# Patient Record
Sex: Male | Born: 1990 | Race: Black or African American | Hispanic: No | Marital: Married | Smoking: Never smoker
Health system: Southern US, Community
[De-identification: ages and names within clinical notes are randomized; demographics above are authoritative.]

## PROBLEM LIST (undated history)

## (undated) ENCOUNTER — Ambulatory Visit: Discharge status: Absent without leave | Payer: Self-pay

## (undated) DIAGNOSIS — R519 Headache, unspecified: Secondary | ICD-10-CM

## (undated) DIAGNOSIS — R51 Headache: Secondary | ICD-10-CM

## (undated) HISTORY — PX: FINGER SURGERY: SHX640

---
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
## 2014-06-04 ENCOUNTER — Ambulatory Visit: Payer: Self-pay | Admitting: Emergency Medicine

## 2015-08-30 ENCOUNTER — Encounter: Payer: Self-pay | Admitting: Emergency Medicine

## 2015-08-30 ENCOUNTER — Ambulatory Visit (INDEPENDENT_AMBULATORY_CARE_PROVIDER_SITE_OTHER)
Admit: 2015-08-30 | Discharge: 2015-08-30 | Disposition: A | Discharge status: Home / Self Care | Payer: 59 | Attending: Family Medicine | Admitting: Family Medicine

## 2015-08-30 ENCOUNTER — Ambulatory Visit
Admission: EM | Admit: 2015-08-30 | Discharge: 2015-08-30 | Disposition: A | Discharge status: Home / Self Care | Payer: 59 | Attending: Family Medicine | Admitting: Family Medicine

## 2015-08-30 DIAGNOSIS — R51 Headache: Secondary | ICD-10-CM

## 2015-08-30 DIAGNOSIS — R519 Headache, unspecified: Secondary | ICD-10-CM

## 2015-08-30 MED ORDER — SUMATRIPTAN SUCCINATE 6 MG/0.5ML ~~LOC~~ SOLN
6.0000 mg | Freq: Once | SUBCUTANEOUS | Status: AC
  Administered 2015-08-30: 6 mg via SUBCUTANEOUS

## 2015-08-30 MED ORDER — SUMATRIPTAN SUCCINATE 100 MG PO TABS: 100.0000 mg | ORAL_TABLET | ORAL | Status: DC | PRN

## 2015-08-30 MED ORDER — BUTALBITAL-APAP-CAFFEINE 50-325-40 MG PO TABS: 1.0000 | ORAL_TABLET | Freq: Four times a day (QID) | ORAL | Status: AC | PRN

## 2015-08-30 MED ORDER — ONDANSETRON 8 MG PO TBDP: 8.0000 mg | ORAL_TABLET | Freq: Three times a day (TID) | ORAL | Status: DC | PRN

## 2015-08-30 NOTE — ED Provider Notes (Signed)
CSN: [id number]     Arrival date & time 08/30/15  [id number] History   First MD Initiated Contact with Patient 08/30/15 [phone]     Chief Complaint  Patient presents with  . Headache   (Consider location/radiation/quality/duration/timing/severity/associated sxs/prior Treatment) Patient is a 24 y.o. male presenting with headaches. The history is provided by the patient. No language interpreter was used.  Headache Pain location:  Frontal Radiates to:  Does not radiate Severity currently:  3/10 Severity at highest:  10/10 Onset quality:  Sudden Duration:  7 days Timing:  Intermittent Progression:  Waxing and waning Chronicity:  New Similar to prior headaches: no   Context: bright light   Relieved by:  Nothing Ineffective treatments:  None tried Associated symptoms: blurred vision, nausea, photophobia and visual change   Nausea:    Severity:  Moderate   Onset quality:  Sudden   History reviewed. No pertinent past medical history. History reviewed. No pertinent past surgical history. History reviewed. No pertinent family history. Social History  Substance Use Topics  . Smoking status: Never Smoker   . Smokeless tobacco: None  . Alcohol Use: No    Review of Systems  Eyes: Positive for blurred vision and photophobia.  Gastrointestinal: Positive for nausea.  Neurological: Positive for headaches.  All other systems reviewed and are negative.   Allergies  Review of patient's allergies indicates no known allergies.  Home Medications   Prior to Admission medications   Not on File   Meds Ordered and Administered this Visit   Medications  SUMAtriptan (IMITREX) injection 6 mg (6 mg Subcutaneous Given 08/30/15 0817)    BP 129/74 mmHg  Pulse 72  Temp(Src) 97 F (36.1 C) (Tympanic)  Resp 16  Ht 6\' 1"  (1.854 m)  Wt 205 lb (92.987 kg)  BMI 27.05 kg/m2  SpO2 100% No data found.   Physical Exam  Constitutional: He is oriented to person, place, and time. He appears  well-developed and well-nourished.  HENT:  Head: Normocephalic and atraumatic.  Right Ear: External ear normal.  Left Ear: External ear normal.  Nose: Nose normal.  Mouth/Throat: Oropharynx is clear and moist.  Eyes: Conjunctivae and EOM are normal. Pupils are equal, round, and reactive to light.  Neck: Normal range of motion. Neck supple. No thyromegaly present.  Cardiovascular: Normal rate and regular rhythm.   Pulmonary/Chest: Effort normal and breath sounds normal.  Abdominal: Soft. Bowel sounds are normal.  Musculoskeletal: Normal range of motion.  Neurological: He is alert and oriented to person, place, and time. He displays normal reflexes. No cranial nerve deficit. Coordination normal.  Skin: Skin is warm. No erythema.  Psychiatric: He has a normal mood and affect.  Vitals reviewed.   ED Course  Procedures (including critical care time)  Labs Review Labs Reviewed - No data to display  Imaging Review Ct Head Wo Contrast  08/30/2015  CLINICAL DATA:  Severe headache for 1 week. No known injury. Initial encounter. EXAM: CT HEAD WITHOUT CONTRAST TECHNIQUE: Contiguous axial images were obtained from the base of the skull through the vertex without intravenous contrast. COMPARISON:  None. FINDINGS: The brain appears normal without hemorrhage, infarct, mass lesion, mass effect, midline shift or abnormal extra-axial fluid collection. No hydrocephalus or pneumocephalus. Imaged paranasal sinuses and mastoid air cells are clear. The calvarium is intact. IMPRESSION: Normal head CT. Electronically Signed   By: Drusilla Kannerhomas  Dalessio M.D.   On: 08/30/2015 10:12     Visual Acuity Review  Right Eye Distance:   Left Eye  Distance:   Bilateral Distance:    Right Eye Near:   Left Eye Near:    Bilateral Near:         MDM   1. Nonintractable headache, unspecified chronicity pattern, unspecified headache type     Suspected migraine headache just reluctant to make that final diagnosis  with one visit. It did seem to respond to Imitrex. We'll send him home on by mouth Imitrex with instructions on how to use Imitrex, Fioricet if needed for resistant headache and Zofran for nausea when necessary. Recommend following up with establishing a PCP and will give information on Dr. Schuyler Amor. Work note for today and tomorrow.   Hassan Rowan, MD 08/30/15 1116

## 2015-08-30 NOTE — ED Notes (Signed)
CT [facility] of the head [facility]

## 2015-08-30 NOTE — Discharge Instructions (Signed)
General Headache Without Cause °A headache is pain or discomfort felt around the head or neck area. There are many causes and types of headaches. In some cases, the cause may not be found.  °HOME CARE  °Managing Pain °· Take over-the-counter and prescription medicines only as told by your doctor. °· Lie down in a dark, quiet room when you have a headache. °· If directed, apply ice to the head and neck area: °¨ Put ice in a plastic bag. °¨ Place a towel between your skin and the bag. °¨ Leave the ice on for 20 minutes, 2-3 times per day. °· Use a heating pad or hot shower to apply heat to the head and neck area as told by your doctor. °· Keep lights dim if bright lights bother you or make your headaches worse. °Eating and Drinking °· Eat meals on a regular schedule. °· Lessen how much alcohol you drink. °· Lessen how much caffeine you drink, or stop drinking caffeine. °General Instructions °· Keep all follow-up visits as told by your doctor. This is important. °· Keep a journal to find out if certain things bring on headaches. For example, write down: °· What you eat and drink. °· How much sleep you get. °· Any change to your diet or medicines. °· Relax by getting a massage or doing other relaxing activities. °· Lessen stress. °· Sit up straight. Do not tighten (tense) your muscles. °· Do not use tobacco products. This includes cigarettes, chewing tobacco, or e-cigarettes. If you need help quitting, ask your doctor. °· Exercise regularly as told by your doctor. °· Get enough sleep. This often means 7-9 hours of sleep. °GET HELP IF: °· Your symptoms are not helped by medicine. °· You have a headache that feels different than the other headaches. °· You feel sick to your stomach (nauseous) or you throw up (vomit). °· You have a fever. °GET HELP RIGHT AWAY IF:  °· Your headache becomes really bad. °· You keep throwing up. °· You have a stiff neck. °· You have trouble seeing. °· You have trouble speaking. °· You have  pain in the eye or ear. °· Your muscles are weak or you lose muscle control. °· You lose your balance or have trouble walking. °· You feel like you will pass out (faint) or you pass out. °· You have confusion. °  °This information is not intended to replace advice given to you by your health care provider. Make sure you discuss any questions you have with your health care provider. °  °Document Released: 08/01/2008 Document Revised: 07/14/2015 Document Reviewed: 02/15/2015 °Elsevier Interactive Patient Education ©2016 [employer] ° °Migraine Headache °A migraine headache is very bad, throbbing pain on one or both sides of your head. Talk to your doctor about what things may bring on (trigger) your migraine headaches. °HOME CARE °· Only take medicines as told by your doctor. °· Lie down in a dark, quiet room when you have a migraine. °· Keep a journal to find out if certain things bring on migraine headaches. For example, write down: °¨ What you eat and drink. °¨ How much sleep you get. °¨ Any change to your diet or medicines. °· Lessen how much alcohol you drink. °· Quit smoking if you smoke. °· Get enough sleep. °· Lessen any stress in your life. °· Keep lights dim if bright lights bother you or make your migraines worse. °GET HELP RIGHT AWAY IF:  °· Your migraine becomes really bad. °·   You have a fever. °· You have a stiff neck. °· You have trouble seeing. °· Your muscles are weak, or you lose muscle control. °· You lose your balance or have trouble walking. °· You feel like you will pass out (faint), or you pass out. °· You have really bad symptoms that are different than your first symptoms. °MAKE SURE YOU:  °· Understand these instructions. °· Will watch your condition. °· Will get help right away if you are not doing well or get worse. °  °This information is not intended to replace advice given to you by your health care provider. Make sure you discuss any questions you have with your health care  provider. °  °Document Released: 08/01/2008 Document Revised: 01/15/2012 Document Reviewed: 06/30/2013 °Elsevier Interactive Patient Education ©2016 [employer] ° °

## 2015-08-30 NOTE — ED Notes (Signed)
Patient c/o headaches and nausea and vomitting off and on for a week.  Patient denies any cold symptoms.

## 2015-10-05 ENCOUNTER — Ambulatory Visit
Admission: EM | Admit: 2015-10-05 | Discharge: 2015-10-05 | Disposition: A | Discharge status: Home / Self Care | Payer: 59 | Attending: Family Medicine | Admitting: Family Medicine

## 2015-10-05 DIAGNOSIS — G43009 Migraine without aura, not intractable, without status migrainosus: Secondary | ICD-10-CM | POA: Diagnosis not present

## 2015-10-05 DIAGNOSIS — T50905A Adverse effect of unspecified drugs, medicaments and biological substances, initial encounter: Secondary | ICD-10-CM | POA: Diagnosis not present

## 2015-10-05 HISTORY — DX: Headache: R51

## 2015-10-05 HISTORY — DX: Headache, unspecified: R51.9

## 2015-10-05 MED ORDER — SUMATRIPTAN SUCCINATE 100 MG PO TABS: 100.0000 mg | ORAL_TABLET | ORAL | Status: DC | PRN

## 2015-10-05 MED ORDER — MELOXICAM 15 MG PO TABS
15.0000 mg | ORAL_TABLET | Freq: Every day | ORAL | Status: DC
Start: 2015-10-05 — End: 2016-10-09

## 2015-10-05 NOTE — ED Notes (Signed)
States seen here 08/30/15 with c/o migraine h/a. Given medication "that is too strong to go to work using and my supervisor told me to get something different so I can work safe"

## 2015-10-05 NOTE — Discharge Instructions (Signed)
Migraine Headache  A migraine headache is very bad, throbbing pain on one or both sides of your head. Talk to your doctor about what things may bring on (trigger) your migraine headaches.  HOME CARE  · Only take medicines as told by your doctor.  · Lie down in a dark, quiet room when you have a migraine.  · Keep a journal to find out if certain things bring on migraine headaches. For example, write down:    What you eat and drink.    How much sleep you get.    Any change to your diet or medicines.  · Lessen how much alcohol you drink.  · Quit smoking if you smoke.  · Get enough sleep.  · Lessen any stress in your life.  · Keep lights dim if bright lights bother you or make your migraines worse.  GET HELP RIGHT AWAY IF:   · Your migraine becomes really bad.  · You have a fever.  · You have a stiff neck.  · You have trouble seeing.  · Your muscles are weak, or you lose muscle control.  · You lose your balance or have trouble walking.  · You feel like you will pass out (faint), or you pass out.  · You have really bad symptoms that are different than your first symptoms.  MAKE SURE YOU:   · Understand these instructions.  · Will watch your condition.  · Will get help right away if you are not doing well or get worse.     This information is not intended to replace advice given to you by your health care provider. Make sure you discuss any questions you have with your health care provider.     Document Released: 08/01/2008 Document Revised: 01/15/2012 Document Reviewed: 06/30/2013  Elsevier Interactive Patient Education ©2016 [employer]

## 2015-10-05 NOTE — ED Provider Notes (Signed)
CSN: [id number]     Arrival date & time 10/05/15  1325 History   First MD Initiated Contact with Patient 10/05/15 1458    Nurses notes were reviewed. Chief Complaint  Patient presents with  . Medication Refill    Patient is here for follow-up and medication side effect. He states Imitrex is working well for his headaches and is really not having headaches before however when he has had a breakout headache he's taking Imitrex and the fears that he is experiencing sedation. Last night his work sent him home when he took combination to medications. He did not know which medication caused the sedation effect. He reports no headaches today.  (Consider location/radiation/quality/duration/timing/severity/associated sxs/prior Treatment) The history is provided by the patient. No language interpreter was used.    Past Medical History  Diagnosis Date  . Headache    History reviewed. No pertinent past surgical history. Family History  Problem Relation Age of Onset  . Hypertension Father    Social History  Substance Use Topics  . Smoking status: Never Smoker   . Smokeless tobacco: None  . Alcohol Use: No    Review of Systems  All other systems reviewed and are negative.   Allergies  Review of patient's allergies indicates no known allergies.  Home Medications   Prior to Admission medications   Medication Sig Start Date End Date Taking? Authorizing Provider  butalbital-acetaminophen-caffeine (FIORICET) 50-325-40 MG tablet Take 1-2 tablets by mouth every 6 (six) hours as needed for headache. 08/30/15 08/29/16 Yes Hassan RowanEugene Aki Burdin, MD  ondansetron (ZOFRAN ODT) 8 MG disintegrating tablet Take 1 tablet (8 mg total) by mouth every 8 (eight) hours as needed for nausea or vomiting. 08/30/15  Yes Hassan RowanEugene Ewing Fandino, MD  meloxicam (MOBIC) 15 MG tablet Take 1 tablet (15 mg total) by mouth daily. 10/05/15   Hassan RowanEugene Ashan Cueva, MD  SUMAtriptan (IMITREX) 100 MG tablet Take 1 tablet (100 mg total) by mouth every 2  (two) hours as needed for migraine. May repeat in 2 hours if headache persists or recurs. 10/05/15   Hassan RowanEugene Song Garris, MD   Meds Ordered and Administered this Visit  Medications - No data to display  BP 141/77 mmHg  Pulse 76  Temp(Src) 97.6 F (36.4 C) (Tympanic)  Resp 16  Ht 6\' 1"  (1.854 m)  Wt 205 lb (92.987 kg)  BMI 27.05 kg/m2  SpO2 100% No data found.   Physical Exam  Constitutional: He is oriented to person, place, and time. He appears well-developed and well-nourished.  HENT:  Head: Normocephalic and atraumatic.  Eyes: Pupils are equal, round, and reactive to light.  Neck: Neck supple.  Musculoskeletal: Normal range of motion.  Neurological: He is alert and oriented to person, place, and time.  Skin: Skin is warm and dry.  Psychiatric: He has a normal mood and affect.  Vitals reviewed.   ED Course  Procedures (including critical care time)  Labs Review Labs Reviewed - No data to display  Imaging Review No results found.   Visual Acuity Review  Right Eye Distance:   Left Eye Distance:   Bilateral Distance:    Right Eye Near:   Left Eye Near:    Bilateral Near:         MDM   1. Migraine without aura and without status migrainosus, not intractable   2. Medication side effects present, initial encounter     Patient is here for his recurrent migraines. Imitrex worked real well renew his prescription Imitrex and place on Mobic  15 mg which I recommend he use assistive if your sets. He may still use the Fioricet on weekends when he is not working. If needed and I did recommend he follow-up PCP to get established so he can get further prescriptions of Imitrex in the future.     Hassan Rowan, MD 10/05/15 [phone]

## 2016-10-09 ENCOUNTER — Ambulatory Visit
Admission: EM | Admit: 2016-10-09 | Discharge: 2016-10-09 | Disposition: A | Discharge status: Home / Self Care | Payer: 59 | Attending: Family Medicine | Admitting: Family Medicine

## 2016-10-09 ENCOUNTER — Ambulatory Visit (INDEPENDENT_AMBULATORY_CARE_PROVIDER_SITE_OTHER): Payer: 59

## 2016-10-09 DIAGNOSIS — R059 Cough, unspecified: Secondary | ICD-10-CM

## 2016-10-09 DIAGNOSIS — R918 Other nonspecific abnormal finding of lung field: Secondary | ICD-10-CM | POA: Diagnosis not present

## 2016-10-09 DIAGNOSIS — J111 Influenza due to unidentified influenza virus with other respiratory manifestations: Secondary | ICD-10-CM

## 2016-10-09 DIAGNOSIS — R42 Dizziness and giddiness: Secondary | ICD-10-CM | POA: Diagnosis present

## 2016-10-09 DIAGNOSIS — R9389 Abnormal findings on diagnostic imaging of other specified body structures: Secondary | ICD-10-CM

## 2016-10-09 DIAGNOSIS — J40 Bronchitis, not specified as acute or chronic: Secondary | ICD-10-CM | POA: Diagnosis not present

## 2016-10-09 DIAGNOSIS — R938 Abnormal findings on diagnostic imaging of other specified body structures: Secondary | ICD-10-CM

## 2016-10-09 DIAGNOSIS — R05 Cough: Secondary | ICD-10-CM | POA: Diagnosis not present

## 2016-10-09 LAB — RAPID INFLUENZA A&B ANTIGENS
Influenza A (ARMC): NEGATIVE
Influenza B (ARMC): NEGATIVE

## 2016-10-09 MED ORDER — LEVOFLOXACIN 500 MG PO TABS: 500.0000 mg | ORAL_TABLET | Freq: Every day | ORAL | 0 refills | Status: DC

## 2016-10-09 MED ORDER — OSELTAMIVIR PHOSPHATE 75 MG PO CAPS: 75.0000 mg | ORAL_CAPSULE | Freq: Two times a day (BID) | ORAL | 0 refills | Status: DC

## 2016-10-09 MED ORDER — ACETAMINOPHEN 325 MG PO TABS
650.0000 mg | ORAL_TABLET | Freq: Once | ORAL | Status: AC
  Administered 2016-10-09: 650 mg via ORAL

## 2016-10-09 NOTE — ED Provider Notes (Signed)
[facility]    CSN: [id number] Arrival date & time: 10/09/16  1552     History   Chief Complaint Chief Complaint  Patient presents with  . Dizziness  . Tachycardia    HPI Shane Bradshaw is a 25 y.o. male.   25 yo male with a c/o 2 days of fevers, bodyaches, shortness of breath, chest pain and dizziness. Denies palpitations, neck/jaw pain, left arm pain, cough, wheezing. States otherwise generally healthy.    The history is provided by the patient.  Dizziness    Past Medical History:  Diagnosis Date  . Headache     There are no active problems to display for this patient.   History reviewed. No pertinent surgical history.     Home Medications    Prior to Admission medications   Medication Sig Start Date End Date Taking? Authorizing Provider  ibuprofen (ADVIL,MOTRIN) 800 MG tablet Take 800 mg by mouth every 8 (eight) hours as needed.   Yes Historical Provider, MD  levofloxacin (LEVAQUIN) 500 MG tablet Take 1 tablet (500 mg total) by mouth daily. 10/09/16   Payton Mccallumrlando Bijou Easler, MD  oseltamivir (TAMIFLU) 75 MG capsule Take 1 capsule (75 mg total) by mouth 2 (two) times daily. 10/09/16   Payton Mccallumrlando Storm Sovine, MD    Family History Family History  Problem Relation Age of Onset  . Hypertension Father     Social History Social History  Substance Use Topics  . Smoking status: Never Smoker  . Smokeless tobacco: Never Used  . Alcohol use No     Allergies   Patient has no known allergies.   Review of Systems Review of Systems  Neurological: Positive for dizziness.     Physical Exam Triage Vital Signs ED Triage Vitals  Enc Vitals Group     BP 10/09/16 1602 116/74     Pulse Rate 10/09/16 1602 (!) 125     Resp 10/09/16 1602 18     Temp 10/09/16 1602 100.2 F (37.9 C)     Temp Source 10/09/16 1602 Oral     SpO2 10/09/16 1602 98 %     Weight 10/09/16 1602 228 lb (103.4 kg)     Height 10/09/16 1602 6\' 1"  (1.854 m)     Head Circumference --      Peak  Flow --      Pain Score 10/09/16 1604 10     Pain Loc --      Pain Edu? --      Excl. in GC? --    No data found.   Updated Vital Signs BP 116/74 (BP Location: Left Arm)   Pulse (!) 125   Temp 100.2 F (37.9 C) (Oral)   Resp 18   Ht 6\' 1"  (1.854 m)   Wt 228 lb (103.4 kg)   SpO2 98%   BMI 30.08 kg/m   Visual Acuity Right Eye Distance:   Left Eye Distance:   Bilateral Distance:    Right Eye Near:   Left Eye Near:    Bilateral Near:     Physical Exam  Constitutional: He appears well-developed and well-nourished. No distress.  HENT:  Head: Normocephalic and atraumatic.  Right Ear: Tympanic membrane, external ear and ear canal normal.  Left Ear: Tympanic membrane, external ear and ear canal normal.  Nose: Nose normal.  Mouth/Throat: Uvula is midline, oropharynx is clear and moist and mucous membranes are normal. No oropharyngeal exudate or tonsillar abscesses.  Eyes: Conjunctivae and EOM are normal. Pupils are equal,  round, and reactive to light. Right eye exhibits no discharge. Left eye exhibits no discharge. No scleral icterus.  Neck: Normal range of motion. Neck supple. No tracheal deviation present. No thyromegaly present.  Cardiovascular: Normal rate, regular rhythm and normal heart sounds.   Pulmonary/Chest: Effort normal. No stridor. No respiratory distress. He has no wheezes. He has rales (right mid lung). He exhibits no tenderness.  Lymphadenopathy:    He has no cervical adenopathy.  Neurological: He is alert.  Skin: Skin is warm and dry. No rash noted. He is not diaphoretic.  Nursing note and vitals reviewed.    UC Treatments / Results  Labs (all labs ordered are listed, but only abnormal results are displayed) Labs Reviewed  RAPID INFLUENZA A&B ANTIGENS [facility] ONLY)    EKG  EKG Interpretation None       Radiology Dg Chest 2 View  Result Date: 10/09/2016 CLINICAL DATA:  Dizziness.  Shortness of breath . EXAM: CHEST  2 VIEW COMPARISON:  No recent  . FINDINGS: Heart size normal. Linear density is noted over the right mid chest. This may represent mild pleural thickening. To exclude developing pneumomediastinum follow-up PA lateral chest x-ray suggested. Lungs are clear. No pleural effusion or pneumothorax. Mild thoracic spine scoliosis. IMPRESSION: Faint linear density noted over the right mid chest. This may represent mild pleural thickening. To exclude developing pneumomediastinum, follow-up PA lateral chest x-ray suggested. No other focal abnormality identified. These results will be called to the ordering clinician or representative by the Radiologist Assistant, and communication documented in the PACS or zVision Dashboard. Electronically Signed   By: Maisie Fushomas  Register   On: 10/09/2016 16:50    Procedures Procedures (including critical care time)  Medications Ordered in UC Medications  acetaminophen (TYLENOL) tablet 650 mg (650 mg Oral Given 10/09/16 1617)     Initial Impression / Assessment and Plan / UC Course  I have reviewed the triage vital signs and the nursing notes.  Pertinent labs & imaging results that were available during my care of the patient were reviewed by me and considered in my medical decision making (see chart for details).  Clinical Course       Final Clinical Impressions(s) / UC Diagnoses   Final diagnoses:  Cough  Bronchitis  Abnormal chest x-ray  Influenza-like syndrome    New Prescriptions Discharge Medication List as of 10/09/2016  5:12 PM    START taking these medications   Details  levofloxacin (LEVAQUIN) 500 MG tablet Take 1 tablet (500 mg total) by mouth daily., Starting Mon 10/09/2016, Normal       1. x-ray results (abnormal)  and diagnosis reviewed with patient 2. rx as per orders above; reviewed possible side effects, interactions, risks and benefits; rx for Tamiflu also sent to pharmacy 3. Recommend supportive treatment with increased fluids, rest 4. Follow-up with PCP or here in 2-3  days for recheck and repeat CXR (see radiology report); follow up sooner or go to ED if symptoms worsen   Payton Mccallumrlando Adanya Sosinski, MD 10/09/16 1816

## 2016-10-09 NOTE — ED Triage Notes (Signed)
Pt states he woke up this afternoon dizzy and having some chest pains and really bad headache. Yesterday he had the chills and a low grade fever.

## 2016-10-09 NOTE — Discharge Instructions (Signed)
Follow up later this week for recheck or sooner if symptoms worsen

## 2016-10-12 ENCOUNTER — Ambulatory Visit (INDEPENDENT_AMBULATORY_CARE_PROVIDER_SITE_OTHER): Payer: 59

## 2016-10-12 ENCOUNTER — Encounter: Payer: Self-pay | Admitting: *Deleted

## 2016-10-12 ENCOUNTER — Ambulatory Visit
Admission: EM | Admit: 2016-10-12 | Discharge: 2016-10-12 | Disposition: A | Discharge status: Home / Self Care | Payer: 59 | Attending: Family Medicine | Admitting: Family Medicine

## 2016-10-12 DIAGNOSIS — R938 Abnormal findings on diagnostic imaging of other specified body structures: Secondary | ICD-10-CM

## 2016-10-12 DIAGNOSIS — J189 Pneumonia, unspecified organism: Secondary | ICD-10-CM

## 2016-10-12 DIAGNOSIS — J181 Lobar pneumonia, unspecified organism: Secondary | ICD-10-CM | POA: Diagnosis not present

## 2016-10-12 DIAGNOSIS — R0789 Other chest pain: Secondary | ICD-10-CM

## 2016-10-12 DIAGNOSIS — R9389 Abnormal findings on diagnostic imaging of other specified body structures: Secondary | ICD-10-CM

## 2016-10-12 NOTE — ED Provider Notes (Signed)
[facility]    CSN: [id number] Arrival date & time: 10/12/16  1622     History   Chief Complaint Chief Complaint  Patient presents with  . Chest Pain  . Headache  . Follow-up    HPI Shane Bradshaw is a 25 y.o. male.   Patient's here for follow-up with abnormal chest x-ray he was seen by Dr. Izell Carolinaonti Monday at that time chest x-ray was abnormal. They cannot rule out a pneumonia versus a pneumo mediastinum is S follow-up PCP came back here for follow-up. Axis states he feels better but he still has some concern about his diagnosis. He never smoked father has hypertension he has no known drug allergies he's taking Levaquin for pneumonia at this time.   The history is provided by the patient and a significant other. No language interpreter was used.  Chest Pain  Pain location:  Substernal area and R chest Pain radiates to:  Does not radiate Pain severity:  Mild Progression:  Improving Chronicity:  New Context: not breathing, not drug use and not eating   Relieved by: Antibiotics(Levaquin) Associated symptoms: headache   Risk factors: male sex   Headache    Past Medical History:  Diagnosis Date  . Headache     There are no active problems to display for this patient.   History reviewed. No pertinent surgical history.     Home Medications    Prior to Admission medications   Medication Sig Start Date End Date Taking? Authorizing Provider  ibuprofen (ADVIL,MOTRIN) 800 MG tablet Take 800 mg by mouth every 8 (eight) hours as needed.   Yes Historical Provider, MD  levofloxacin (LEVAQUIN) 500 MG tablet Take 1 tablet (500 mg total) by mouth daily. 10/09/16  Yes Payton Mccallumrlando Conty, MD  oseltamivir (TAMIFLU) 75 MG capsule Take 1 capsule (75 mg total) by mouth 2 (two) times daily. 10/09/16  Yes Payton Mccallumrlando Conty, MD    Family History Family History  Problem Relation Age of Onset  . Hypertension Father     Social History Social History  Substance Use Topics  . Smoking  status: Never Smoker  . Smokeless tobacco: Never Used  . Alcohol use No     Allergies   Patient has no known allergies.   Review of Systems Review of Systems  Cardiovascular: Positive for chest pain.  Neurological: Positive for headaches.  All other systems reviewed and are negative.    Physical Exam Triage Vital Signs ED Triage Vitals  Enc Vitals Group     BP 10/12/16 1653 129/76     Pulse Rate 10/12/16 1653 77     Resp 10/12/16 1653 16     Temp 10/12/16 1653 98 F (36.7 C)     Temp Source 10/12/16 1653 Oral     SpO2 10/12/16 1653 99 %     Weight 10/12/16 1654 227 lb (103 kg)     Height 10/12/16 1654 6\' 1"  (1.854 m)     Head Circumference --      Peak Flow --      Pain Score --      Pain Loc --      Pain Edu? --      Excl. in GC? --    No data found.   Updated Vital Signs BP 129/76 (BP Location: Left Arm)   Pulse 77   Temp 98 F (36.7 C) (Oral)   Resp 16   Ht 6\' 1"  (1.854 m)   Wt 227 lb (103 kg)  SpO2 99%   BMI 29.95 kg/m   Visual Acuity Right Eye Distance:   Left Eye Distance:   Bilateral Distance:    Right Eye Near:   Left Eye Near:    Bilateral Near:     Physical Exam  Constitutional: He appears well-developed and well-nourished.  HENT:  Head: Normocephalic and atraumatic.  Right Ear: External ear normal.  Left Ear: External ear normal.  Mouth/Throat: Oropharynx is clear and moist.  Eyes: EOM are normal. Pupils are equal, round, and reactive to light.  Neck: Normal range of motion. Neck supple.  Cardiovascular: Normal rate, regular rhythm and normal heart sounds.   Pulmonary/Chest: Effort normal and breath sounds normal.  Musculoskeletal: Normal range of motion.  Neurological: He is alert. No cranial nerve deficit.  Skin: Skin is warm and dry.  Vitals reviewed.    UC Treatments / Results  Labs (all labs ordered are listed, but only abnormal results are displayed) Labs Reviewed - No data to display  EKG  EKG  Interpretation None       Radiology Dg Chest 2 View  Result Date: 10/12/2016 CLINICAL DATA:  Follow-up abnormal chest x-ray. Evaluated 3 days prior for fever, weakness and chest pain common clinically improved. EXAM: CHEST  2 VIEW COMPARISON:  Chest radiograph 10/09/2016, with concern for pneumomediastinum. FINDINGS: The previous linear density noted over the right mid chest on prior exam is no longer seen. There is no evidence of pneumomediastinum. The lungs are clear. No focal airspace disease, pleural fluid or pneumothorax. Minimal scoliotic curvature of the thoracic spine, no acute osseous abnormality. IMPRESSION: Previous linear density projecting over the mediastinum is no longer seen. There is no acute abnormality. No evidence of pneumomediastinum or pleural thickening. Electronically Signed   By: Rubye OaksMelanie  Ehinger M.D.   On: 10/12/2016 18:03    Procedures Procedures (including critical care time)  Medications Ordered in UC Medications - No data to display   Initial Impression / Assessment and Plan / UC Course  I have reviewed the triage vital signs and the nursing notes.  Pertinent labs & imaging results that were available during my care of the patient were reviewed by me and considered in my medical decision making (see chart for details).  Clinical Course as of Oct 12 1845  Thu Oct 12, 2016  1803 DG Chest 2 View [EW]    Clinical Course User Index [EW] Hassan RowanEugene Amena Dockham, MD   Patient was informed the chest x-ray is negative continue Levaquin. Work note for today if needed reassurance given to him and his significant other.   Final Clinical Impressions(s) / UC Diagnoses   Final diagnoses:  Atypical chest pain  Abnormal CXR (chest x-ray)  Community acquired pneumonia of right upper lobe of lung [facility](HCC)    New Prescriptions Discharge Medication List as of 10/12/2016  6:30 PM     .Note: This dictation was prepared with Dragon dictation along with smaller phrase technology.  Any transcriptional errors that result from this process are unintentional.      Hassan RowanEugene Aly Hauser, MD 10/12/16 [id number]

## 2016-10-12 NOTE — ED Triage Notes (Signed)
Pt seen here Monday. Returns today, as instructed for follow-up exam and possible xray. Pt states significant improvement.

## 2016-12-30 ENCOUNTER — Ambulatory Visit
Admission: EM | Admit: 2016-12-30 | Discharge: 2016-12-30 | Disposition: A | Discharge status: Home / Self Care | Payer: 59 | Attending: Emergency Medicine | Admitting: Emergency Medicine

## 2016-12-30 ENCOUNTER — Encounter: Payer: Self-pay | Admitting: *Deleted

## 2016-12-30 DIAGNOSIS — L0591 Pilonidal cyst without abscess: Secondary | ICD-10-CM

## 2016-12-30 MED ORDER — IBUPROFEN 800 MG PO TABS: 800.0000 mg | ORAL_TABLET | Freq: Three times a day (TID) | ORAL | 0 refills | Status: DC

## 2016-12-30 MED ORDER — TRAMADOL HCL 50 MG PO TABS: 50.0000 mg | ORAL_TABLET | Freq: Four times a day (QID) | ORAL | 0 refills | Status: DC | PRN

## 2016-12-30 MED ORDER — DOXYCYCLINE HYCLATE 100 MG PO CAPS: 100.0000 mg | ORAL_CAPSULE | Freq: Two times a day (BID) | ORAL | 0 refills | Status: AC

## 2016-12-30 NOTE — Discharge Instructions (Signed)
.  Return here or follow up with your doctor in 2 days for a wound check. Give us a working phone number so that we contact you if we need to change your antibiotics. Take the medication as written. Take 1 gram of tylenol with the motrin up to 3 times a day as needed for pain . This is an effective combination for pain. Take the  tramadol only for severe pain.  Return to the ED if you get worse, have a persistent fever >100.4, or for any concerns.

## 2016-12-30 NOTE — ED Provider Notes (Signed)
HPI  SUBJECTIVE:  Shane Bradshaw is a 26 y.o. male who presents with a painful erythematous mass of gradually increasing size on his left gluteal cleft starting yesterday. He reports constant, sharp pain. Symptoms are worse with sitting down and movement, better with heat treat oil. He has not tried anything else for this. No other symptoms before, but it drained on its own. he did not seek medical attention. No N/V, fevers, drainage, lymphangitis.  No bodyaches, generalized weakness.  Does not recall insect bite, trauma to area. No contacts with similar lesions. -  H/o MRSA skin infections. No h/o DM, HIV, artifical joints or heart valves. PMD: none   Past Medical History:  Diagnosis Date  . Headache     History reviewed. No pertinent surgical history.  Family History  Problem Relation Age of Onset  . Hypertension Father     Social History  Substance Use Topics  . Smoking status: Never Smoker  . Smokeless tobacco: Never Used  . Alcohol use No    No current facility-administered medications for this encounter.   Current Outpatient Prescriptions:  .  doxycycline (VIBRAMYCIN) 100 MG capsule, Take 1 capsule (100 mg total) by mouth 2 (two) times daily., Disp: 14 capsule, Rfl: 0 .  ibuprofen (ADVIL,MOTRIN) 800 MG tablet, Take 1 tablet (800 mg total) by mouth 3 (three) times daily., Disp: 30 tablet, Rfl: 0 .  traMADol (ULTRAM) 50 MG tablet, Take 1 tablet (50 mg total) by mouth every 6 (six) hours as needed., Disp: 15 tablet, Rfl: 0  No Known Allergies   ROS  As noted in HPI.   Physical Exam  BP 131/83 (BP Location: Left Arm)   Temp 98.6 F (37 C) (Oral)   Resp 16   Ht 6\' 2"  (1.88 m)   Wt 229 lb (103.9 kg)   SpO2 98%   BMI 29.40 kg/m   Constitutional: Well developed, well nourished, no acute distress Eyes:  EOMI, conjunctiva normal bilaterally HENT: Normocephalic, atraumatic,mucus membranes moist Respiratory: Normal inspiratory effort Cardiovascular: Normal rate GI:  nondistended skin: 6.5 x 3 cm tender area of induration in the left gluteal cleft with central fluctuance consistent with infected pilonidal cyst. No expressible purulent drainage. There does not appear to be any perirectal abscess. Marked area of induration with a marker for reference. Musculoskeletal: no deformities Neurologic: Alert & oriented x 3, no focal neuro deficits Psychiatric: Speech and behavior appropriate   ED Course   Medications - No data to display  Orders Placed This Encounter  Procedures  . Deep Wound or Surgical Culture (Abcess or Tissue Only)    Standing Status:   Standing    Number of Occurrences:   1    Order Specific Question:   Patient immune status    Answer:   Normal    No results found for this or any previous visit (from the past 24 hour(s)). No results found.  ED Clinical Impression  Infected pilonidal cyst   ED Assessment/Plan  Procedure note: Cleaned the area with chlorhexidine. Using 1 cc of 1% plain lidocaine local infiltration with adequate anesthesia. Made a single linear incision with an 11 blade. Expressed moderate amount of pus. Probed wound to break up loculations. Wound cultures were sent. It was then irrigated with 100 cc of normal saline. Packing then placed. Bulky dressing then placed. Patient tolerated procedure well.  Home with doxycycline, ibuprofen, tramadol, return to clinic in 2 days for packing removal reevaluation. To the ER if he gets worse. Will  provide a primary care referral. Advised patient that he will need to have this definitively moved by general surgery once this heals. [facility]   Discussed  MDM, plan and followup with patient . Discussed sn/sx that should prompt return to the  ED. Patient agrees with plan.   Meds ordered this encounter  Medications  . doxycycline (VIBRAMYCIN) 100 MG capsule    Sig: Take 1 capsule (100 mg total) by mouth 2 (two) times daily.     Dispense:  14 capsule    Refill:  0  . ibuprofen (ADVIL,MOTRIN) 800 MG tablet    Sig: Take 1 tablet (800 mg total) by mouth 3 (three) times daily.    Dispense:  30 tablet    Refill:  0  . traMADol (ULTRAM) 50 MG tablet    Sig: Take 1 tablet (50 mg total) by mouth every 6 (six) hours as needed.    Dispense:  15 tablet    Refill:  0    *This clinic note was created using Scientist, clinical (histocompatibility and immunogenetics)Dragon dictation software. Therefore, there may be occasional mistakes despite careful proofreading.  ?    Domenick GongAshley Corrisa Gibby, MD 12/30/16 [phone]

## 2016-12-30 NOTE — ED Triage Notes (Signed)
Abcess to lower back x2 -3 days.

## 2017-01-01 ENCOUNTER — Ambulatory Visit
Admission: EM | Admit: 2017-01-01 | Discharge: 2017-01-01 | Disposition: A | Discharge status: Home / Self Care | Payer: 59 | Attending: Family Medicine | Admitting: Family Medicine

## 2017-01-01 DIAGNOSIS — Z5189 Encounter for other specified aftercare: Secondary | ICD-10-CM

## 2017-01-01 NOTE — Discharge Instructions (Signed)
Continue medication as prescribed. Rest. Drink plenty of fluids. Apply warm compresses as discussed. Keep clean.   Follow up with surgery as directed.   Follow up with your primary care physician this week as needed. Return to Urgent care for new or worsening concerns.

## 2017-01-01 NOTE — ED Provider Notes (Signed)
[facility] ____________________________________________  Time seen: Approximately 9:22 AM  I have reviewed the triage vital signs and the nursing notes.   HISTORY  Chief Complaint Wound Check  HPI Shane Bradshaw is a 26 y.o. male presents for wound check of pilonidal abscess. Patient reports seen in urgent care 2 days ago for same complaint and at that time had incision and drainage of abscess and started on oral antibiotics. Reports has been taking oral antibiotics as prescribed. Patient reports he is feeling much better. Denies fevers. Reports occasional drainage from site, but reports drainage has much improved. Reports site is no longer painful. Reports continues to eat and drink well. Again reports feeling much better. Reports normal urination and bowel movements.  Denies chest pain, shortness of breath, abdominal pain, dysuria, extremity pain, extremity swelling or rash. Denies recent sickness. Denies recent antibiotic use.    Past Medical History:  Diagnosis Date  . Headache     There are no active problems to display for this patient.   History reviewed. No pertinent surgical history.   No current facility-administered medications for this encounter.   Current Outpatient Prescriptions:  .  doxycycline (VIBRAMYCIN) 100 MG capsule, Take 1 capsule (100 mg total) by mouth 2 (two) times daily., Disp: 14 capsule, Rfl: 0 .  ibuprofen (ADVIL,MOTRIN) 800 MG tablet, Take 1 tablet (800 mg total) by mouth 3 (three) times daily., Disp: 30 tablet, Rfl: 0 .  traMADol (ULTRAM) 50 MG tablet, Take 1 tablet (50 mg total) by mouth every 6 (six) hours as needed., Disp: 15 tablet, Rfl: 0  Allergies Patient has no known allergies.  Family History  Problem Relation Age of Onset  . Hypertension Father     Social History Social History  Substance Use Topics  . Smoking status: Never Smoker  . Smokeless tobacco: Never Used  . Alcohol use No    Review of  Systems Constitutional: No fever/chills Eyes: No visual changes. ENT: No sore throat. Cardiovascular: Denies chest pain. Respiratory: Denies shortness of breath. Gastrointestinal: No abdominal pain.  No nausea, no vomiting.  No diarrhea.  No constipation. Genitourinary: Negative for dysuria. Musculoskeletal: Negative for back pain. Skin: Negative for rash.As above. Neurological: Negative for headaches, focal weakness or numbness.  10-point ROS otherwise negative.  ____________________________________________   PHYSICAL EXAM:  VITAL SIGNS: ED Triage Vitals  Enc Vitals Group     BP 01/01/17 0902 127/75     Pulse Rate 01/01/17 0902 76     Resp 01/01/17 0902 17     Temp 01/01/17 0902 98.3 F (36.8 C)     Temp Source 01/01/17 0902 Oral     SpO2 01/01/17 0902 100 %     Weight 01/01/17 0901 229 lb (103.9 kg)     Height 01/01/17 0901 6\' 2"  (1.88 m)     Head Circumference --      Peak Flow --      Pain Score 01/01/17 0901 1     Pain Loc --      Pain Edu? --      Excl. in GC? --     Constitutional: Alert and oriented. Well appearing and in no acute distress. Eyes: Conjunctivae are normal. PERRL. EOMI. ENT      Head: Normocephalic and atraumatic. Cardiovascular: Normal rate, regular rhythm. Grossly normal heart sounds.  Good peripheral circulation. Respiratory: Normal respiratory effort without tachypnea nor retractions. Breath sounds are clear and equal bilaterally. No wheezes, rales, rhonchi. Gastrointestinal: Soft and nontender.  Musculoskeletal:  Ambulatory with steady gait. Neurologic:  Normal speech and language. Speech is normal. No gait instability.  Skin:  Skin is warm, dry and intact. Exam completed with Selena Batten RN at bedside a chaperone. Except: Left gluteal cleft area of approximately 3.5 x 2 cm of induration, no fluctuance, packing present, no erythema, nontender, no surrounding tenderness, packing removed and patient tolerated well, no drainage expressed with  palpation.  Psychiatric: Mood and affect are normal. Speech and behavior are normal. Patient exhibits appropriate insight and judgment   ___________________________________________   LABS (all labs ordered are listed, but only abnormal results are displayed)  Labs Reviewed - No data to display  RADIOLOGY  No results found. ____________________________________________   PROCEDURES Procedures    INITIAL IMPRESSION / ASSESSMENT AND PLAN / ED COURSE  Pertinent labs & imaging results that were available during my care of the patient were reviewed by me and considered in my medical decision making (see chart for details).  Well-appearing patient. No acute distress. Presents for wound check follow-up post abscess. Packing removed. Patient tolerated well. Site appears improved compared by patient report and previous documentation. Dressing applied. no indication for repacking at this time. Discussed keeping clean and warm compresses. Continue home antibiotic and supportive care. Follow-up with outpatient surgery as previously directed. Patient verbalized understanding and agreed to this plan.  Discussed follow up with Primary care physician this week. Discussed follow up and return parameters including no resolution or any worsening concerns. Patient verbalized understanding and agreed to plan.   ____________________________________________   FINAL CLINICAL IMPRESSION(S) / ED DIAGNOSES  Final diagnoses:  Wound check, abscess     New Prescriptions   No medications on file    Note: This dictation was prepared with Dragon dictation along with smaller phrase technology. Any transcriptional errors that result from this process are unintentional.         Renford Dills, NP 01/01/17 1136

## 2017-01-01 NOTE — ED Triage Notes (Signed)
Patient is here for removal of packing and to see how wound is doing since last visit on 12/30/2016. Patient reports improvement and less pain.

## 2017-01-05 ENCOUNTER — Other Ambulatory Visit: Payer: Self-pay

## 2017-01-05 LAB — AEROBIC/ANAEROBIC CULTURE W GRAM STAIN (SURGICAL/DEEP WOUND)

## 2017-01-05 LAB — AEROBIC/ANAEROBIC CULTURE (SURGICAL/DEEP WOUND): SPECIAL REQUESTS: NORMAL

## 2017-01-10 ENCOUNTER — Encounter: Payer: Self-pay | Admitting: General Surgery

## 2017-01-10 ENCOUNTER — Ambulatory Visit (INDEPENDENT_AMBULATORY_CARE_PROVIDER_SITE_OTHER): Payer: 59 | Admitting: General Surgery

## 2017-01-10 VITALS — BP 135/77 | HR 82 | Temp 97.8°F | Ht 73.0 in | Wt 225.0 lb

## 2017-01-10 DIAGNOSIS — L0591 Pilonidal cyst without abscess: Secondary | ICD-10-CM

## 2017-01-10 NOTE — Patient Instructions (Signed)
Pilonidal Cyst A pilonidal cyst is a fluid-filled sac. It forms beneath the skin near your tailbone, at the top of the crease of your buttocks. A pilonidal cyst that is not large or infected may not cause symptoms or problems. If the cyst becomes irritated or infected, it may fill with pus. This causes pain and swelling (pilonidal abscess). An infected cyst may need to be treated with medicine, drained, or removed. What are the causes? The cause of a pilonidal cyst is not known. One cause may be a hair that grows into your skin (ingrown hair). What increases the risk? Pilonidal cysts are more common in boys and men. Risk factors include:  Having lots of hair near the crease of the buttocks.  Being overweight.  Having a pilonidal dimple.  Wearing tight clothing.  Not bathing or showering frequently.  Sitting for long periods of time. What are the signs or symptoms? Signs and symptoms of a pilonidal cyst may include:  Redness.  Pain and tenderness.  Warmth.  Swelling.  Pus.  Fever. How is this diagnosed? Your health care provider may diagnose a pilonidal cyst based on your symptoms and a physical exam. The health care provider may do a blood test to check for infection. If your cyst is draining pus, your health care provider may take a sample of the drainage to be tested at a laboratory. How is this treated? Surgery is the usual treatment for an infected pilonidal cyst. You may also have to take medicines before surgery. The type of surgery you have depends on the size and severity of the infected cyst. The different kinds of surgery include:  Incision and drainage. This is a procedure to open and drain the cyst.  Marsupialization. In this procedure, a large cyst or abscess may be opened and kept open by stitching the edges of the skin to the cyst walls.  Cyst removal. This procedure involves opening the skin and removing all or part of the cyst. Follow these instructions at  home:  Follow all of your surgeon's instructions carefully if you had surgery.  Take medicines only as directed by your health care provider.  If you were prescribed an antibiotic medicine, finish it all even if you start to feel better.  Keep the area around your pilonidal cyst clean and dry.  Clean the area as directed by your health care provider. Pat the area dry with a clean towel. Do not rub it as this may cause bleeding.  Remove hair from the area around the cyst as directed by your health care provider.  Do not wear tight clothing or sit in one place for long periods of time.  There are many different ways to close and cover an incision, including stitches, skin glue, and adhesive strips. Follow your health care provider's instructions on:  Incision care.  Bandage (dressing) changes and removal.  Incision closure removal. Contact a health care provider if:  You have drainage, redness, swelling, or pain at the site of the cyst.  You have a fever. This information is not intended to replace advice given to you by your health care provider. Make sure you discuss any questions you have with your health care provider. Document Released: 10/20/2000 Document Revised: 03/30/2016 Document Reviewed: 03/12/2014 Elsevier Interactive Patient Education  2017 [employer]  

## 2017-01-10 NOTE — Progress Notes (Signed)
Patient ID: Shane Bradshaw, male   DOB: 1991/10/29, 26 y.o.   MRN: [id number]  CC: Pilonidal Cyst  HPI Shane Bradshaw is a 26 y.o. male who presents to clinic today for evaluation of a pilonidal cyst. Patient reports she was seen in urgent care approximately 2 weeks ago where he had an incision and drainage when he was told was a pilonidal cyst. He he stated at that time it was large, swollen, painful. After incision and drainage was placed on antibiotics and had complete resolution of his symptoms. He reports that he is continued to improve and has completed his antibiotics and has not had any drainage in many days. He denies any fevers, chills, nausea, vomiting, chest pain, shortness of breath, diarrhea, constipation. He reports he thinks he had something similar to this when he was a teenager but he has not had any in many years. 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  HPI  Past Medical History:  Diagnosis Date  . Headache     Past Surgical History:  Procedure Laterality Date  . NO PAST SURGERIES      Family History  Problem Relation Age of Onset  . Healthy Mother   . Hypertension Father   . Cancer Neg Hx     Social History Social History  Substance Use Topics  . Smoking status: Never Smoker  . Smokeless tobacco: Never Used  . Alcohol use No    No Known Allergies  No current outpatient prescriptions on file.   No current facility-administered medications for this visit.      Review of Systems A Multi-point review of systems was asked and was negative except for the findings documented in the history of present illness  Physical Exam Blood pressure 135/77, pulse 82, temperature 97.8 F (36.6 C), temperature source Oral, height 6\' 1"  (1.854 m), weight 102.1 kg (225 lb). CONSTITUTIONAL: No acute distress. EYES: Pupils are equal, round, and reactive to light, Sclera are  non-icteric. EARS, NOSE, MOUTH AND THROAT: The oropharynx is clear. The oral mucosa is pink and moist. Hearing is intact to voice. LYMPH NODES:  Lymph nodes in the neck are normal. RESPIRATORY:  Lungs are clear. There is normal respiratory effort, with equal breath sounds bilaterally, and without pathologic use of accessory muscles. CARDIOVASCULAR: Heart is regular without murmurs, gallops, or rubs. GI: The abdomen is soft, nontender, and nondistended. There are no palpable masses. There is no hepatosplenomegaly. There are normal bowel sounds in all quadrants. GU: Rectal exam shows area of previous I&D over the level of the coccyx. There are no obvious cysts, abscesses, pores. There is no active drainage in the area appears to be healing well The area is very hirsute.   MUSCULOSKELETAL: Normal muscle strength and tone. No cyanosis or edema.   SKIN: Turgor is good and there are no pathologic skin lesions or ulcers. NEUROLOGIC: Motor and sensation is grossly normal. Cranial nerves are grossly intact. PSYCH:  Oriented to person, place and time. Affect is normal.  Data Reviewed No images are labs review for this encounter I have personally reviewed the patient's imaging, laboratory findings and medical records.    Assessment    Pilonidal cyst    Plan    26 year old male with a history that sounds like an infected pilonidal cyst status post I&D by urgent care. The area is completely healed. There is no obvious pilonidal  cysts or area of infection at this point. Discussed pilonidal cyst versus simple abscesses to the areas as well as the causes of them how to prevent them from recurring. In the setting of bilateral cyst discussed that should the area recur and require multiple I&D's or multiple rounds of antibiotics he within be an indication for surgical intervention. However the absence of this discussed that I would not recommend surgical excision at this point. Patient voiced understanding. All  questions drainage to the patient's satisfaction he will follow-up in clinic on an as-needed basis.     Time spent with the patient was 30 minutes, with more than 50% of the time spent in face-to-face education, counseling and care coordination.     Shane Frameharles Texie Tupou, MD FACS General Surgeon 01/10/2017, 3:40 PM

## 2017-02-23 ENCOUNTER — Encounter: Payer: Self-pay | Admitting: *Deleted

## 2017-02-23 ENCOUNTER — Emergency Department
Admission: EM | Admit: 2017-02-23 | Discharge: 2017-02-23 | Disposition: A | Discharge status: Other Acute Inpatient Hospital | Payer: Worker's Compensation | Attending: Emergency Medicine | Admitting: Emergency Medicine

## 2017-02-23 ENCOUNTER — Emergency Department: Payer: Worker's Compensation

## 2017-02-23 DIAGNOSIS — Y990 Civilian activity done for income or pay: Secondary | ICD-10-CM | POA: Diagnosis not present

## 2017-02-23 DIAGNOSIS — Y9389 Activity, other specified: Secondary | ICD-10-CM | POA: Diagnosis not present

## 2017-02-23 DIAGNOSIS — Z23 Encounter for immunization: Secondary | ICD-10-CM | POA: Insufficient documentation

## 2017-02-23 DIAGNOSIS — S61218A Laceration without foreign body of other finger without damage to nail, initial encounter: Secondary | ICD-10-CM

## 2017-02-23 DIAGNOSIS — Y929 Unspecified place or not applicable: Secondary | ICD-10-CM | POA: Insufficient documentation

## 2017-02-23 DIAGNOSIS — S6991XA Unspecified injury of right wrist, hand and finger(s), initial encounter: Secondary | ICD-10-CM | POA: Diagnosis present

## 2017-02-23 DIAGNOSIS — S6710XA Crushing injury of unspecified finger(s), initial encounter: Secondary | ICD-10-CM

## 2017-02-23 DIAGNOSIS — S62630A Displaced fracture of distal phalanx of right index finger, initial encounter for closed fracture: Secondary | ICD-10-CM | POA: Insufficient documentation

## 2017-02-23 DIAGNOSIS — W298XXA Contact with other powered powered hand tools and household machinery, initial encounter: Secondary | ICD-10-CM | POA: Insufficient documentation

## 2017-02-23 DIAGNOSIS — S62600B Fracture of unspecified phalanx of right index finger, initial encounter for open fracture: Secondary | ICD-10-CM

## 2017-02-23 MED ORDER — HYDROMORPHONE HCL 1 MG/ML IJ SOLN
0.5000 mg | Freq: Once | INTRAMUSCULAR | Status: AC
  Administered 2017-02-23: 0.5 mg via INTRAVENOUS
  Filled 2017-02-23: qty 1

## 2017-02-23 MED ORDER — OXYCODONE-ACETAMINOPHEN 5-325 MG PO TABS
2.0000 | ORAL_TABLET | Freq: Once | ORAL | Status: AC
  Administered 2017-02-23: 2 via ORAL
  Filled 2017-02-23: qty 2

## 2017-02-23 MED ORDER — LIDOCAINE HCL (PF) 1 % IJ SOLN
5.0000 mL | Freq: Once | INTRAMUSCULAR | Status: AC
  Administered 2017-02-23: 5 mL
  Filled 2017-02-23: qty 5

## 2017-02-23 MED ORDER — CEFAZOLIN IN D5W 1 GM/50ML IV SOLN
1.0000 g | Freq: Once | INTRAVENOUS | Status: AC
  Administered 2017-02-23: 1 g via INTRAVENOUS
  Filled 2017-02-23: qty 50

## 2017-02-23 MED ORDER — TETANUS-DIPHTH-ACELL PERTUSSIS 5-2.5-18.5 LF-MCG/0.5 IM SUSP
0.5000 mL | Freq: Once | INTRAMUSCULAR | Status: AC
  Administered 2017-02-23: 0.5 mL via INTRAMUSCULAR
  Filled 2017-02-23: qty 0.5

## 2017-02-23 MED ORDER — ONDANSETRON 4 MG PO TBDP
4.0000 mg | ORAL_TABLET | Freq: Once | ORAL | Status: AC
  Administered 2017-02-23: 4 mg via ORAL
  Filled 2017-02-23: qty 1

## 2017-02-23 MED ORDER — LIDOCAINE HCL (PF) 1 % IJ SOLN
5.0000 mL | Freq: Once | INTRAMUSCULAR | Status: AC
  Administered 2017-02-23: 5 mL via INTRADERMAL
  Filled 2017-02-23: qty 5

## 2017-02-23 NOTE — ED Provider Notes (Signed)
[facility] Emergency Department Provider Note       Time seen: ----------------------------------------- 9:14 AM on 02/23/2017 -----------------------------------------     I have reviewed the triage vital signs and the nursing notes.   HISTORY   Chief Complaint Laceration    HPI Shane Bradshaw is a 26 y.o. male who presents to the ED for an injury that occurred to his right index finger at work. Patient reportedly cut off the tip of his right index finger with a pneumatic press. Pain is 8/10 in the right index finger, nothing makes it better. He denies other injuries   Past Medical History:  Diagnosis Date  . Headache     There are no active problems to display for this patient.   Past Surgical History:  Procedure Laterality Date  . NO PAST SURGERIES      Allergies Patient has no known allergies.  Social History Social History  Substance Use Topics  . Smoking status: Never Smoker  . Smokeless tobacco: Never Used  . Alcohol use No   Review of Systems Constitutional: Negative for fever. Musculoskeletal: Positive for pain in the right index finger Skin: positive for right index finger laceration, amputation Neurological: Negative for headaches, focal weakness or numbness.  10-point ROS otherwise negative.  ____________________________________________   PHYSICAL EXAM:  VITAL SIGNS: ED Triage Vitals  Enc Vitals Group     BP 02/23/17 0911 (!) 119/56     Pulse Rate 02/23/17 0911 (!) 59     Resp 02/23/17 0911 18     Temp 02/23/17 0911 97.5 F (36.4 C)     Temp Source 02/23/17 0911 Oral     SpO2 02/23/17 0911 98 %     Weight 02/23/17 0904 235 lb (106.6 kg)     Height 02/23/17 0904 6' (1.829 m)     Head Circumference --      Peak Flow --      Pain Score 02/23/17 0903 8     Pain Loc --      Pain Edu? --      Excl. in GC? --    Constitutional: Alert and oriented. Well appearing and in no distress. Eyes: Conjunctivae are  normal. Normal extraocular movements. Musculoskeletal: Extensive right index finger lacerations with partial fingertip amputation and nail avulsion. There is likely underlying extensor tendon injury. Mild bleeding is noted Neurologic:  Normal speech and language. No gross focal neurologic deficits are appreciated.  Skin: Stents of maceration of the right index finger beyond the proximal interphalangeal joint. Psychiatric: Mood and affect are normal. Speech and behavior are normal.  ____________________________________________  ED COURSE:  Pertinent labs & imaging results that were available during my care of the patient were reviewed by me and considered in my medical decision making (see chart for details). Patient presents for right index finger injury, we will assess with labs and imaging as indicated.   Marland Kitchen.Laceration Repair Date/Time: 02/23/2017 11:09 AM Performed by: Emily Filbert Authorized by: Daryel November E   Consent:    Consent obtained:  Verbal   Consent given by:  Patient   Alternatives discussed:  Referral Anesthesia (see MAR for exact dosages):    Anesthesia method:  Local infiltration   Local anesthetic:  Lidocaine 1% w/o epi Laceration details:    Location:  Finger   Finger location:  R index finger   Length (cm):  12   Depth (mm):  10 Repair type:    Repair type:  Complex Pre-procedure details:  Preparation:  Patient was prepped and draped in usual sterile fashion Exploration:    Limited defect created (wound extended): no     Wound exploration: wound explored through full range of motion     Wound extent: fascia violated, tendon damage and underlying fracture     Tendon damage location:  Upper extremity   Upper extremity tendon damage location:  Finger extensor   Tendon damage extent:  Partial transection   Tendon repair plan:  Refer for evaluation   Contaminated: no   Treatment:    Area cleansed with:  Betadine   Amount of cleaning:   Standard   Irrigation solution:  Sterile saline   Visualized foreign bodies/material removed: no     Debridement:  None   Undermining:  None   Scar revision: no   Skin repair:    Repair method:  Sutures   Suture size:  5-0   Suture material:  Prolene   Suture technique:  Running   Number of sutures:  10 Approximation:    Approximation:  Loose Post-procedure details:    Dressing:  Bulky dressing   Patient tolerance of procedure:  Tolerated well, no immediate complications Comments:     I began to try to repair his complicated right index finger laceration and was able to reattach the finger tip loosely. However this injury is much more complex than initially appreciated and he would benefit from a hand surgeon. Family has requested transfer to [facility] for hand surgery.   ____________________________________________   RADIOLOGY Images were viewed by me  Right index finger IMPRESSION: 1. Fractures of the distal phalanx of the right index finger as detailed. 2. 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 ____________________________________________  FINAL ASSESSMENT AND PLAN  Crush injury to the right index finger, maceration, tendon involvement, fingertip avulsion, phalanx fracture  Plan: Patient's imaging were dictated above. Patient had presented for a crush injury to the right index finger. He is right-hand dominant and the wound is more than I can repair. I did try to reattach the finger tip to preserve it and loosely reapproximate the finger which is lacerated and multiple areas beyond the proximal interphalangeal joint. There also appears to be underlying tendon involvement. I discussed with the [facility] and the patient was accepted in transfer by Dr. Genelle Gather. Patient was given Ancef, tetanus shot, pain medicine and remains comfortable at this time.  Emily Filbert, MD   Note: This note was generated in part or whole with  voice recognition software. Voice recognition is usually quite accurate but there are transcription errors that can and very often do occur. I apologize for any typographical errors that were not detected and corrected.     Emily Filbert, MD 02/23/17 [phone]

## 2017-02-23 NOTE — ED Notes (Signed)
E-signature box not working. Pt verbalized consent to transfer to [facility] ED.

## 2017-02-23 NOTE — ED Notes (Signed)
Urine drug screen performed. 

## 2017-02-23 NOTE — ED Triage Notes (Signed)
Pt cut the tip of right fingertip off in a machine at work

## 2017-03-06 DIAGNOSIS — IMO0002 Reserved for concepts with insufficient information to code with codable children: Secondary | ICD-10-CM | POA: Insufficient documentation

## 2017-03-14 ENCOUNTER — Encounter: Payer: Self-pay | Admitting: *Deleted

## 2017-03-14 ENCOUNTER — Ambulatory Visit (INDEPENDENT_AMBULATORY_CARE_PROVIDER_SITE_OTHER): Payer: 59

## 2017-03-14 ENCOUNTER — Ambulatory Visit
Admission: EM | Admit: 2017-03-14 | Discharge: 2017-03-14 | Disposition: A | Discharge status: Home / Self Care | Payer: 59 | Attending: Family Medicine | Admitting: Family Medicine

## 2017-03-14 DIAGNOSIS — S60132A Contusion of left middle finger with damage to nail, initial encounter: Secondary | ICD-10-CM

## 2017-03-14 NOTE — Discharge Instructions (Signed)
Keep clean. Topical antibiotic. Frequent warm soapy water soaks as discussed.   Follow up with your primary care physician this week as needed. Return to Urgent care for new or worsening concerns.

## 2017-03-14 NOTE — ED Provider Notes (Signed)
[facility] ____________________________________________  Time seen: Approximately 3:45 PM  I have reviewed the triage vital signs and the nursing notes.   HISTORY  Chief Complaint Finger Injury   HPI Shane Bradshaw is a 26 y.o. male presenting for evaluation of left third digit pain and swelling after injury that occurred 3 days ago when helping a friend. Patient states that he knows friend were working at his friend's house, and states while they're working at object fell causing crush and hitting injury to left third finger. Reports pain and swelling around the distal finger and nail since injury. Denies pain radiation, paresthesias or decreased range of motion. Denies bleeding or break in skin. Denies fall, head injury, loss of consciousness other pain or injury. Reports right-handed. Reports otherwise feels well. Reports tetanus immunization is up-to-date. Past Medical History:  Diagnosis Date  . Headache     There are no active problems to display for this patient.   Past Surgical History:  Procedure Laterality Date  . NO PAST SURGERIES       No current facility-administered medications for this encounter.  No current outpatient prescriptions on file.  Allergies Patient has no known allergies.  Family History  Problem Relation Age of Onset  . Healthy Mother   . Hypertension Father   . Cancer Neg Hx     Social History Social History  Substance Use Topics  . Smoking status: Never Smoker  . Smokeless tobacco: Never Used  . Alcohol use No    Review of Systems Constitutional: No fever/chills Cardiovascular: Denies chest pain. Respiratory: Denies shortness of breath. Genitourinary: Negative for dysuria. Musculoskeletal: Negative for back pain.As above. Skin: Negative for rash.  ____________________________________________   PHYSICAL EXAM:  VITAL SIGNS: ED Triage Vitals  Enc Vitals Group     BP 03/14/17 1515 128/76     Pulse Rate 03/14/17  1515 70     Resp 03/14/17 1515 16     Temp 03/14/17 1515 98.6 F (37 C)     Temp Source 03/14/17 1515 Oral     SpO2 03/14/17 1515 99 %     Weight --      Height --      Head Circumference --      Peak Flow --      Pain Score 03/14/17 1519 8     Pain Loc --      Pain Edu? --      Excl. in GC? --     Constitutional: Alert and oriented. Well appearing and in no acute distress. Cardiovascular: Normal rate, regular rhythm. Grossly normal heart sounds.  Good peripheral circulation. Respiratory: Normal respiratory effort without tachypnea nor retractions. Breath sounds are clear and equal bilaterally. No wheezes, rales, rhonchi. Gastrointestinal: Soft and nontender. No distention. Normal Bowel sounds. No CVA tenderness. Musculoskeletal: No midline cervical, thoracic or lumbar tenderness to palpation. Except : Left hand third digit distal phalanx mild to moderate tenderness to direct palpation with large subungual hematoma noted, skin intact, no erythema, normal distal sensation, good resisted distal third digit flexion and extension, no motor or tendon deficit noted. Left hand otherwise nontender. Neurologic:  Normal speech and language.  Speech is normal. No gait instability.  Skin:  Skin is warm, dry. Psychiatric: Mood and affect are normal. Speech and behavior are normal. Patient exhibits appropriate insight and judgment   ___________________________________________   LABS (all labs ordered are listed, but only abnormal results are displayed)  Labs Reviewed - No data to display  RADIOLOGY  Dg Finger Middle Left  Result Date: 03/14/2017 CLINICAL DATA:  Blunt trauma to third digit with pain, initial encounter EXAM: LEFT MIDDLE FINGER 2+V COMPARISON:  None. FINDINGS: There is no evidence of fracture or dislocation. There is no evidence of arthropathy or other focal bone abnormality. Soft tissues are unremarkable. IMPRESSION: No acute abnormality noted. Electronically Signed   By: Alcide Clever M.D.   On: 03/14/2017 15:52   ____________________________________________   PROCEDURES Procedures   Procedure(s) performed:  Procedure(s) performed:  Procedure explained and verbal consent obtained. Consent: Verbal consent obtained. Written consent not obtained. Risks and benefits: risks, benefits and alternatives were discussed Patient identity confirmed: verbally with patient and hospital-assigned identification number  Consent given by: patient   Subungual hematoma drainage Location: Left third digit  Anesthesia: None Irrigation solution: saline and betadine Amount of cleaning: copious Sterile cautery pen utilized and small hole made at proximal lateral nail with immediate return of moderate amount of dark blood. Moderate amount of blood also expressed with improved appearance of subungual hematoma immediately. Patient tolerated well. Patient reports immediate improved pain after subungual drainage. dressing applied.  Wound care instructions provided.  Observe for any signs of infection or other problems.    ____________________________________________   INITIAL IMPRESSION / ASSESSMENT AND PLAN / ED COURSE  Pertinent labs & imaging results that were available during my care of the patient were reviewed by me and considered in my medical decision making (see chart for details).  Well-appearing patient. No acute distress. Left third digit x-ray per radiologist no acute abnormality noted. Patient with large subungual hematoma. Subungual hematoma drained with cautery pen bore made the proximal nail edge with immediate return. Patient reports improved pain and pressure sensation after cautery. Patient tolerated well. Discussed keeping clean, topical antibiotics, warm soapy water soaks frequently. Discussed strict follow-up and return parameters.  Discussed follow up with Primary care physician this week as needed. Discussed follow up and return parameters including no  resolution or any worsening concerns. Patient verbalized understanding and agreed to plan.   ____________________________________________   FINAL CLINICAL IMPRESSION(S) / ED DIAGNOSES  Final diagnoses:  Subungual hematoma of left middle finger  Contusion of left middle finger with damage to nail, initial encounter     There are no discharge medications for this patient.   Note: This dictation was prepared with Dragon dictation along with smaller phrase technology. Any transcriptional errors that result from this process are unintentional.         Renford Dills, NP 03/14/17 1651

## 2017-03-14 NOTE — ED Triage Notes (Signed)
Patient injured his left middle finger when he tried to catch a falling object while helping a friend. The injury is a combination of being "mashed and "jammed". Finger nail is discolored.

## 2017-04-09 ENCOUNTER — Encounter: Payer: Self-pay | Admitting: Surgery

## 2017-04-09 ENCOUNTER — Ambulatory Visit (INDEPENDENT_AMBULATORY_CARE_PROVIDER_SITE_OTHER): Payer: 59 | Admitting: Surgery

## 2017-04-09 VITALS — BP 128/79 | HR 97 | Temp 99.3°F | Ht 72.0 in | Wt 225.0 lb

## 2017-04-09 DIAGNOSIS — L0501 Pilonidal cyst with abscess: Secondary | ICD-10-CM | POA: Diagnosis not present

## 2017-04-09 MED ORDER — METRONIDAZOLE 500 MG PO TABS: 500.0000 mg | ORAL_TABLET | Freq: Three times a day (TID) | ORAL | 1 refills | Status: DC

## 2017-04-09 MED ORDER — SULFAMETHOXAZOLE-TRIMETHOPRIM 400-80 MG PO TABS: 1.0000 | ORAL_TABLET | Freq: Two times a day (BID) | ORAL | 1 refills | Status: DC

## 2017-04-09 MED ORDER — HYDROCODONE-ACETAMINOPHEN 5-300 MG PO TABS: 1.0000 | ORAL_TABLET | ORAL | 0 refills | Status: DC | PRN

## 2017-04-09 NOTE — Progress Notes (Signed)
Outpatient Surgical Follow Up  04/09/2017  Shane Bradshaw is an 26 y.o. male.   CC: Recurrent pilonidal cyst/abscess  HPI: 1 day history of worsening buttock pain. He's had this happen before and had an I&D of pilonidal abscess. He never had it excised. He's had no fevers or chills and just has increased pain he is not allergic to any medicines and he has no serious medical problems.  Past Medical History:  Diagnosis Date  . Headache     Past Surgical History:  Procedure Laterality Date  . NO PAST SURGERIES      Family History  Problem Relation Age of Onset  . Healthy Mother   . Hypertension Father   . Cancer Neg Hx     Social History:  reports that he has never smoked. He has never used smokeless tobacco. He reports that he does not drink alcohol or use drugs.  Allergies: No Known Allergies  Medications reviewed.   Review of Systems:   Review of Systems  Constitutional: Negative.   HENT: Negative.   Eyes: Negative.   Respiratory: Negative.   Cardiovascular: Negative.   Gastrointestinal: Negative.   Genitourinary: Negative.   Musculoskeletal: Negative.   Skin: Negative.   Neurological: Negative.   Endo/Heme/Allergies: Negative.   Psychiatric/Behavioral: Negative.      Physical Exam:  BP 128/79   Pulse 97   Temp 99.3 F (37.4 C) (Oral)   Ht 6' (1.829 m)   Wt 225 lb (102.1 kg)   BMI 30.52 kg/m   Physical Exam  Constitutional: He is oriented to person, place, and time and well-developed, well-nourished, and in no distress. No distress.  HENT:  Head: Normocephalic and atraumatic.  Abdominal: Soft. Bowel sounds are normal. He exhibits no distension. There is no tenderness.  Neurological: He is alert and oriented to person, place, and time.  Skin: Skin is warm. He is not diaphoretic. There is erythema.  Tenderness to pilonidal area with fluctuance  Vitals reviewed.     No results found for this or any previous visit (from the past 48 hour(s)). No  results found.  Assessment/Plan:  Pilonidal cyst. This is with abscess. It is recurrent. Recommend incision and drainage as he had had done at the urgent care the last time this happened. I will start him on oral antibiotics and oral analgesics as well see separate dictation.  I discussed with him the rationale for I&D and the risks of bleeding infection recurrence and the need to follow-up in the office tomorrow.   After obtaining informed consent local anesthetic was infiltrated in the skin and subcutaneous tissues tissues around the prepped area on the fluctuant area of the buttock. Drainage was performed aseptically with 11 blade. Purulent foul-smelling drainage exuded. A sterile dressing was placed.  Patient will be started on oral antibiotics oral analgesics and will follow up in our office tomorrow for reevaluation  Lattie Hawichard E Anne-Marie Genson, MD, FACS

## 2017-04-09 NOTE — Patient Instructions (Signed)
We will see you back tomorrow. Please see appointment below.  We have sent your antibiotics to the pharmacy for you. Please pick them up today and start them. Take with food.  Please take your pain prescription with you to the pharmacy and take as needed for moderate to severe pain. You may not drive while taking this medication.  Please see work note provided.

## 2017-04-10 ENCOUNTER — Encounter: Payer: Self-pay | Admitting: Surgery

## 2017-04-10 ENCOUNTER — Ambulatory Visit (INDEPENDENT_AMBULATORY_CARE_PROVIDER_SITE_OTHER): Payer: 59 | Admitting: Surgery

## 2017-04-10 VITALS — BP 136/86 | HR 77 | Temp 98.2°F | Wt 225.0 lb

## 2017-04-10 DIAGNOSIS — L0501 Pilonidal cyst with abscess: Secondary | ICD-10-CM

## 2017-04-10 NOTE — Progress Notes (Signed)
Outpatient postop visit  04/10/2017  Shane Bradshaw is an 26 y.o. male.    Procedure: I&D of pilonidal abscess  CC: No pain  HPI: Patient underwent the I&D of a pilonidal abscess yesterday and feels well he has no pain at this time and has not changed his dressing. Denies fevers or chills  Medications reviewed.    Physical Exam:  BP 136/86   Pulse 77   Temp 98.2 F (36.8 C) (Oral)   Wt 225 lb (102.1 kg)   BMI 30.52 kg/m     PE: Wound is clean no erythema no tenderness no purulence expressible    Assessment/Plan:  Patient doing very well after I&D of a pilonidal abscess no sign of recurrence drainage is nil and no purulence. Recommend continuing his oral antibiotics and seeing next week for follow-up unless he is completely healed at which time he could cancel that appointment.  Shane Hawichard E Eliav Mechling, MD, FACS

## 2017-04-16 ENCOUNTER — Encounter: Payer: 59 | Admitting: Surgery

## 2017-04-17 ENCOUNTER — Telehealth: Payer: Self-pay | Admitting: General Practice

## 2017-04-17 NOTE — Telephone Encounter (Signed)
Left a voicemail for patient to call the office to rescheduled no showed appointment on Monday 04/16/17. Please reschedule if patient calls back.

## 2018-12-12 ENCOUNTER — Encounter: Payer: Self-pay | Admitting: Emergency Medicine

## 2018-12-12 ENCOUNTER — Other Ambulatory Visit: Payer: Self-pay

## 2018-12-12 ENCOUNTER — Ambulatory Visit (INDEPENDENT_AMBULATORY_CARE_PROVIDER_SITE_OTHER): Payer: 59

## 2018-12-12 ENCOUNTER — Ambulatory Visit
Admission: EM | Admit: 2018-12-12 | Discharge: 2018-12-12 | Disposition: A | Discharge status: Home / Self Care | Payer: 59 | Attending: Family Medicine | Admitting: Family Medicine

## 2018-12-12 DIAGNOSIS — R079 Chest pain, unspecified: Secondary | ICD-10-CM

## 2018-12-12 DIAGNOSIS — X501XXA Overexertion from prolonged static or awkward postures, initial encounter: Secondary | ICD-10-CM

## 2018-12-12 DIAGNOSIS — M5414 Radiculopathy, thoracic region: Secondary | ICD-10-CM | POA: Diagnosis not present

## 2018-12-12 MED ORDER — MELOXICAM 15 MG PO TABS: 15.0000 mg | ORAL_TABLET | Freq: Every day | ORAL | 0 refills | Status: DC

## 2018-12-12 MED ORDER — METAXALONE 800 MG PO TABS: 800.0000 mg | ORAL_TABLET | Freq: Three times a day (TID) | ORAL | 0 refills | Status: DC

## 2018-12-12 NOTE — ED Triage Notes (Signed)
Patient in today c/o right side and back pain x 2 days. Patient not sure if he hurt himself while trying to move some stuff in the garage. Patient states he may have twisted wrong.

## 2018-12-12 NOTE — Discharge Instructions (Signed)
Apply ice 20 minutes out of every 2 hours 4-5 times daily for comfort.  Avoid symptoms as much as possible. °

## 2018-12-12 NOTE — ED Provider Notes (Signed)
[facility]    CSN: [id number] Arrival date & time: 12/12/18  1015     History   Chief Complaint Chief Complaint  Patient presents with  . Back Pain    HPI Shane Bradshaw is a 28 y.o. male.   HPI  -year-old male presents today with right-sided chest and thoracic back pain that he had for 2 days.  He may have hurt himself while lifting heavy iron shelf twisting abnormally when his foot became entrapped.  Patient did not hurt immediately but later on that evening was much very severe. It has been affecting him with  shortness of breath because it hurts when he takes in deep breath.  No hemoptysis or sputum production present.       Past Medical History:  Diagnosis Date  . Headache     Patient Active Problem List   Diagnosis Date Noted  . Open fracture of distal phalangeal tuft with routine healing 03/06/2017    Past Surgical History:  Procedure Laterality Date  . FINGER SURGERY Right    index       Home Medications    Prior to Admission medications   Medication Sig Start Date End Date Taking? Authorizing Provider  meloxicam (MOBIC) 15 MG tablet Take 1 tablet (15 mg total) by mouth daily. 12/12/18   Lutricia Feil, PA-C  metaxalone (SKELAXIN) 800 MG tablet Take 1 tablet (800 mg total) by mouth 3 (three) times daily. 12/12/18   Lutricia Feil, PA-C    Family History Family History  Problem Relation Age of Onset  . Healthy Mother   . Hypertension Father   . Cancer Neg Hx     Social History Social History   Tobacco Use  . Smoking status: Never Smoker  . Smokeless tobacco: Never Used  Substance Use Topics  . Alcohol use: No  . Drug use: No     Allergies   Patient has no known allergies.   Review of Systems Review of Systems  Constitutional: Positive for activity change. Negative for appetite change, chills, fatigue and fever.  Respiratory: Positive for shortness of breath.   Musculoskeletal: Positive for back pain.  All other  systems reviewed and are negative.    Physical Exam Triage Vital Signs ED Triage Vitals  Enc Vitals Group     BP 12/12/18 1027 130/84     Pulse Rate 12/12/18 1027 79     Resp 12/12/18 1027 16     Temp 12/12/18 1027 98.1 F (36.7 C)     Temp Source 12/12/18 1027 Oral     SpO2 12/12/18 1027 99 %     Weight 12/12/18 1028 230 lb (104.3 kg)     Height 12/12/18 1028 6\' 1"  (1.854 m)     Head Circumference --      Peak Flow --      Pain Score 12/12/18 1027 5     Pain Loc --      Pain Edu? --      Excl. in GC? --    No data found.  Updated Vital Signs BP 130/84 (BP Location: Left Arm)   Pulse 79   Temp 98.1 F (36.7 C) (Oral)   Resp 16   Ht 6\' 1"  (1.854 m)   Wt 230 lb (104.3 kg)   SpO2 99%   BMI 30.34 kg/m   Visual Acuity Right Eye Distance:   Left Eye Distance:   Bilateral Distance:    Right Eye Near:   Left  Eye Near:    Bilateral Near:     Physical Exam Vitals signs and nursing note reviewed.  Constitutional:      General: He is not in acute distress.    Appearance: Normal appearance. He is not ill-appearing, toxic-appearing or diaphoretic.  HENT:     Head: Normocephalic.     Nose: Nose normal.     Mouth/Throat:     Mouth: Mucous membranes are moist.  Eyes:     General:        Right eye: No discharge.        Left eye: No discharge.     Conjunctiva/sclera: Conjunctivae normal.  Neck:     Musculoskeletal: Normal range of motion and neck supple.  Pulmonary:     Effort: Pulmonary effort is normal.     Breath sounds: Normal breath sounds.  Musculoskeletal:        General: Tenderness and signs of injury present.     Comments: He has tenderness in the lower thoracic paraspinous muscles on the right at approximate T10-T12.  This extends around to about the mid axillary line.  He has no tenderness with the bilateral compression or with compression of the sternum.  Thoracic rotation creased with discomfort at the extremes of both left and right rotation.    Neurological:     Mental Status: He is alert.      UC Treatments / Results  Labs (all labs ordered are listed, but only abnormal results are displayed) Labs Reviewed - No data to display  EKG None  Radiology Dg Chest 2 View  Result Date: 12/12/2018 CLINICAL DATA:  Right-sided chest pain after lifting injury. Right-sided pain with inspiration. EXAM: CHEST - 2 VIEW COMPARISON:  10/12/2016 FINDINGS: Heart size is normal. Mediastinal shadows are normal. The lungs are clear. No infiltrate, collapse or effusion. No pneumothorax. Mild chronic spinal curvature. IMPRESSION: No active disease.  Mild chronic spinal curvature. Electronically Signed   By: Paulina Fusi M.D.   On: 12/12/2018 11:16    Procedures Procedures (including critical care time)  Medications Ordered in UC Medications - No data to display  Initial Impression / Assessment and Plan / UC Course  I have reviewed the triage vital signs and the nursing notes.  Pertinent labs & imaging results that were available during my care of the patient were reviewed by me and considered in my medical decision making (see chart for details).   Sustained a thoracic radiculitis worsening that occurred during the lifting of the steel shelving.  He should avoid symptoms as much as possible.  Use ice as necessary for comfort.  We will treat him with Mobic and Skelaxin.  He was given appropriate precautions.   Final Clinical Impressions(s) / UC Diagnoses   Final diagnoses:  Thoracic radiculitis     Discharge Instructions     Apply ice 20 minutes out of every 2 hours 4-5 times daily for comfort. Avoid symptoms as much as possible.    ED Prescriptions    Medication Sig Dispense Auth. Provider   meloxicam (MOBIC) 15 MG tablet Take 1 tablet (15 mg total) by mouth daily. 30 tablet Lutricia Feil, PA-C   metaxalone (SKELAXIN) 800 MG tablet Take 1 tablet (800 mg total) by mouth 3 (three) times daily. 21 tablet Lutricia Feil, PA-C      Controlled Substance Prescriptions [place] Controlled Substance Registry consulted? Not Applicable   Lutricia Feil, PA-C 12/12/18 1253

## 2019-03-25 IMAGING — DX DG FINGER INDEX 2+V*R*
3 series · 3 of 3 positions shown · non-contrast
Comparison: None.

CLINICAL DATA: Pt was at work today and smashed right index finger
in the machine.

EXAM:
RIGHT INDEX FINGER 2+V

[finger ap]
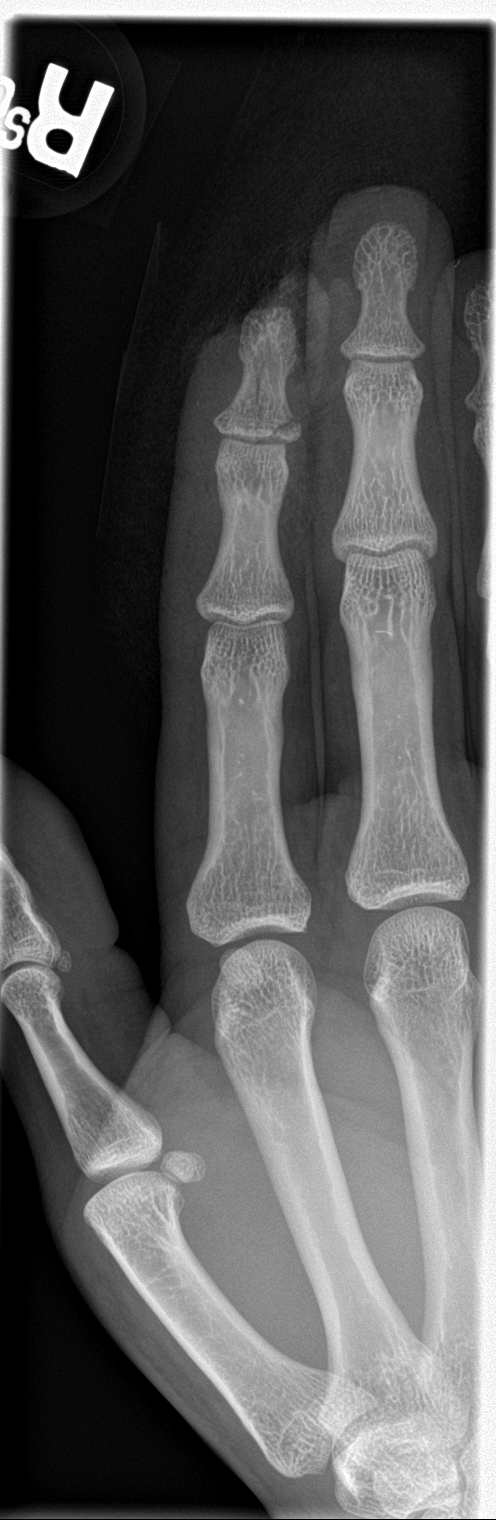

[finger obl]
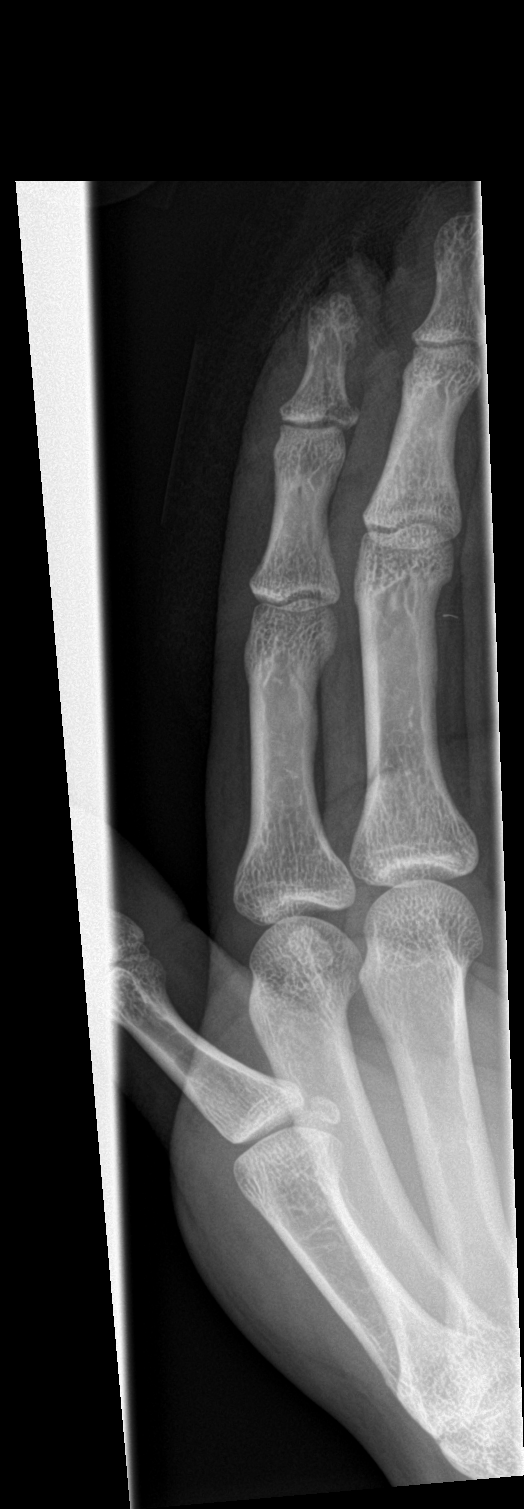

[finger lat]
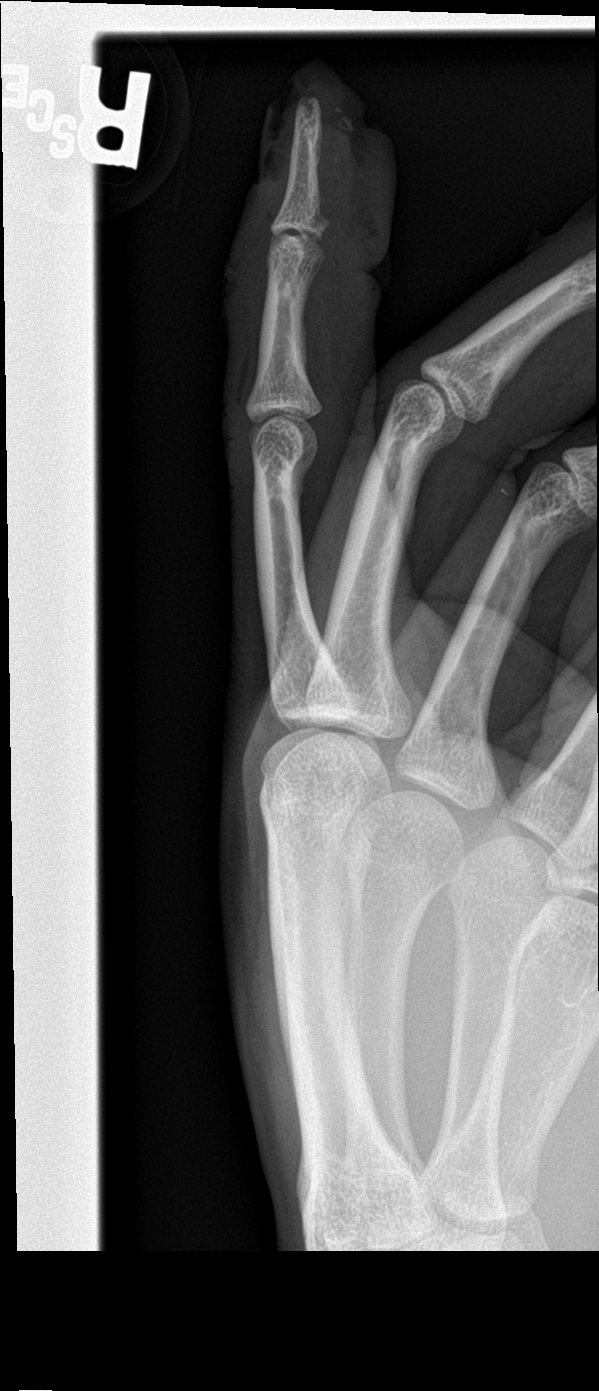

[3 of 3 positions shown; findings below may reference images not displayed]

FINDINGS: There is a soft tissue amputation of the right index finger tip,
exposing a portion of the underlying distal tuft. There is a subtle
comminuted fracture of the distal margin of the distal tuft but too
small bone fragments along the volar margin of the finger tip,
associated with a soft tissue laceration.

There is also a vertical fracture extending from the distal phalanx
base to the mid shaft with another oblique fracture across the ulnar
corner of the base of the distal phalanx that extends to the
articular surface.

Other fractures.  No dislocation.
IMPRESSION: 1. Fractures of the distal phalanx of the right index finger as
detailed.
2. Soft tissue amputation of the index finger tip.

## 2021-10-10 ENCOUNTER — Other Ambulatory Visit: Payer: Self-pay

## 2021-10-10 ENCOUNTER — Ambulatory Visit
Admission: EM | Admit: 2021-10-10 | Discharge: 2021-10-10 | Disposition: A | Discharge status: Home / Self Care | Payer: Self-pay | Attending: Internal Medicine | Admitting: Internal Medicine

## 2021-10-10 ENCOUNTER — Encounter: Payer: Self-pay | Admitting: Licensed Clinical Social Worker

## 2021-10-10 DIAGNOSIS — S0502XA Injury of conjunctiva and corneal abrasion without foreign body, left eye, initial encounter: Secondary | ICD-10-CM

## 2021-10-10 MED ORDER — ERYTHROMYCIN 5 MG/GM OP OINT: TOPICAL_OINTMENT | OPHTHALMIC | 0 refills | Status: DC

## 2021-10-10 NOTE — ED Triage Notes (Signed)
Pt c/o of eye pain in the left eye. "Feels like something is in there since 1:30 am

## 2021-10-10 NOTE — Discharge Instructions (Addendum)
Use the antibiotic ointment three times a day for 5 days If your eye pain gets worse in the next 24- 48 hours, you need to see an eye Doctor.  [facility] [address] [place], [place] [address]  Phone: [phone]

## 2021-10-10 NOTE — ED Provider Notes (Signed)
[facility]    CSN: [id number] Arrival date & time: 10/10/21  1034      History   Chief Complaint Chief Complaint  Patient presents with   Eye Pain    HPI Shane Bradshaw is a 30 y.o. male who presents with L eye pain. Feels like he has something in it since 1:30 am. He got up to use the bathroom in the middle of the night and noticed dried up blood in the tearduct area. Earlier that am, he was working under the hood of his car and felt dirt from it fall on his L eye as his head was sideways. He washed it off and was fine the rest of the day. His vision feels a little blurry. His L eye has been tearing, having L rhinitis as well.     Past Medical History:  Diagnosis Date   Headache     Patient Active Problem List   Diagnosis Date Noted   Open fracture of distal phalangeal tuft with routine healing 03/06/2017    Past Surgical History:  Procedure Laterality Date   FINGER SURGERY Right    index       Home Medications    Prior to Admission medications   Not on File    Family History Family History  Problem Relation Age of Onset   Healthy Mother    Hypertension Father    Cancer Neg Hx     Social History Social History   Tobacco Use   Smoking status: Never   Smokeless tobacco: Never  Vaping Use   Vaping Use: Never used  Substance Use Topics   Alcohol use: No   Drug use: No     Allergies   Patient has no known allergies.   Review of Systems Review of Systems  Eyes:  Positive for photophobia, pain and redness. Negative for discharge and itching.       + tearing , + FB sensation  Neurological:  Negative for headaches.    Physical Exam Triage Vital Signs ED Triage Vitals  Enc Vitals Group     BP 10/10/21 1219 131/85     Pulse Rate 10/10/21 1219 63     Resp 10/10/21 1219 16     Temp 10/10/21 1219 98 F (36.7 C)     Temp Source 10/10/21 1219 Oral     SpO2 10/10/21 1219 100 %     Weight 10/10/21 1218 220 lb (99.8 kg)     Height  10/10/21 1218 6\' 1"  (1.854 m)     Head Circumference --      Peak Flow --      Pain Score 10/10/21 1218 9     Pain Loc --      Pain Edu? --      Excl. in GC? --    No data found.  Updated Vital Signs BP 131/85 (BP Location: Left Arm)   Pulse 63   Temp 98 F (36.7 C) (Oral)   Resp 16   Ht 6\' 1"  (1.854 m)   Wt 220 lb (99.8 kg)   SpO2 100%   BMI 29.03 kg/m   Visual Acuity Right Eye Distance: 20/25 Left Eye Distance: 20/20 Bilateral Distance: 20/20  Right Eye Near:   Left Eye Near:    Bilateral Near:     Physical Exam Vitals reviewed.  Constitutional:      General: He is not in acute distress.    Appearance: Normal appearance. He is not toxic-appearing.  Eyes:     General: Lids are normal. No scleral icterus.       Left eye: Foreign body present.    Extraocular Movements: Extraocular movements intact.     Pupils: Pupils are equal, round, and reactive to light.     Left eye: Corneal abrasion present.      Comments: Has mild injection of sclera, but no perilimbal erythema noted.  Tetracaine used, 1 gtts into L eye. Lids were everted and a couple of dark specs were obtained from under the upper lid.  A wet Qtip with saline was used to dab over uptake Fluorecein, but no FB noted on it.   Neurological:     Mental Status: He is alert.     UC Treatments / Results  Labs (all labs ordered are listed, but only abnormal results are displayed) Labs Reviewed - No data to display  EKG   Radiology No results found.  Procedures Procedures (including critical care time)  Medications Ordered in UC Medications - No data to display  Initial Impression / Assessment and Plan / UC Course  I have reviewed the triage vital signs and the nursing notes.  L corneal abrasion with FB I gave him a tube of Bacitracin zinc and polymyxin B sulfate ointment and educated him how to apply a ribon under L inner lid and blink to spread it around. I applied his first dose here. He is to  continue its use tid x 5 days. See instructions.      Final Clinical Impressions(s) / UC Diagnoses   Final diagnoses:  Abrasion of left cornea, initial encounter     Discharge Instructions      Use the antibiotic ointment three times a day for 5 days If your eye pain gets worse in the next 24- 48 hours, you need to see an eye Doctor.  [facility] [address] [place], [place] [address]  Phone: [phone]     ED Prescriptions     Medication Sig Dispense Auth. Provider   erythromycin ophthalmic ointment  (Status: Discontinued) Place a 1/2 inch ribbon of ointment into the lower L eyelid tid x 5 days 3.5 g Rodriguez-Southworth, Nettie Elm, PA-C      PDMP not reviewed this encounter.   Garey Ham, PA-C 10/10/21 1258
